# Patient Record
Sex: Male | Born: 1980 | Race: White | Hispanic: No | Marital: Married | Smoking: Current every day smoker
Health system: Southern US, Community
[De-identification: ages and names within clinical notes are randomized; demographics above are authoritative.]

## PROBLEM LIST (undated history)

## (undated) DIAGNOSIS — M419 Scoliosis, unspecified: Secondary | ICD-10-CM

## (undated) HISTORY — PX: WISDOM TOOTH EXTRACTION: SHX21

---
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
## 2004-01-30 ENCOUNTER — Emergency Department (HOSPITAL_COMMUNITY): Admission: EM | Admit: 2004-01-30 | Discharge: 2004-01-30 | Payer: Self-pay | Admitting: Emergency Medicine

## 2004-02-19 ENCOUNTER — Emergency Department (HOSPITAL_COMMUNITY): Admission: EM | Admit: 2004-02-19 | Discharge: 2004-02-20 | Payer: Self-pay | Admitting: Emergency Medicine

## 2004-03-30 ENCOUNTER — Ambulatory Visit: Payer: Self-pay | Admitting: Internal Medicine

## 2004-12-27 ENCOUNTER — Emergency Department (HOSPITAL_COMMUNITY): Admission: EM | Admit: 2004-12-27 | Discharge: 2004-12-27 | Payer: Self-pay | Admitting: *Deleted

## 2011-11-09 ENCOUNTER — Emergency Department: Payer: Self-pay | Admitting: Emergency Medicine

## 2012-12-14 ENCOUNTER — Emergency Department: Payer: Self-pay | Admitting: Emergency Medicine

## 2012-12-15 ENCOUNTER — Emergency Department: Payer: Self-pay | Admitting: Emergency Medicine

## 2019-08-05 ENCOUNTER — Emergency Department: Payer: No Typology Code available for payment source

## 2019-08-05 ENCOUNTER — Emergency Department
Admission: EM | Admit: 2019-08-05 | Discharge: 2019-08-05 | Disposition: A | Discharge status: Home / Self Care | Payer: No Typology Code available for payment source | Attending: Emergency Medicine | Admitting: Emergency Medicine

## 2019-08-05 ENCOUNTER — Other Ambulatory Visit: Payer: Self-pay

## 2019-08-05 DIAGNOSIS — Y939 Activity, unspecified: Secondary | ICD-10-CM | POA: Diagnosis not present

## 2019-08-05 DIAGNOSIS — G44319 Acute post-traumatic headache, not intractable: Secondary | ICD-10-CM | POA: Insufficient documentation

## 2019-08-05 DIAGNOSIS — Y92410 Unspecified street and highway as the place of occurrence of the external cause: Secondary | ICD-10-CM | POA: Diagnosis not present

## 2019-08-05 DIAGNOSIS — Y999 Unspecified external cause status: Secondary | ICD-10-CM | POA: Diagnosis not present

## 2019-08-05 DIAGNOSIS — M79641 Pain in right hand: Secondary | ICD-10-CM | POA: Insufficient documentation

## 2019-08-05 DIAGNOSIS — F17200 Nicotine dependence, unspecified, uncomplicated: Secondary | ICD-10-CM | POA: Diagnosis not present

## 2019-08-05 HISTORY — DX: Scoliosis, unspecified: M41.9

## 2019-08-05 MED ORDER — TRAMADOL HCL 50 MG PO TABS: 50.0000 mg | ORAL_TABLET | Freq: Four times a day (QID) | ORAL | 0 refills | Status: DC | PRN
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
## 2019-08-05 NOTE — Discharge Instructions (Signed)
Call the clinics listed on your discharge papers to see if they are taking new patients and establish a primary care provider.  Also take steps to discontinue smoking.  Your primary care provider may refer you to a pulmonologist who specializes in lung diseases.  Your x-rays today did not show any fractures or injury to your head.  You may continue taking over-the-counter medication such as Tylenol or ibuprofen with this medication if needed.  Do not drive or operate machinery while taking tramadol as it could cause drowsiness.
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
## 2019-08-06 ENCOUNTER — Encounter: Payer: Self-pay | Admitting: Nurse Practitioner

## 2019-08-06 ENCOUNTER — Ambulatory Visit: Payer: Self-pay | Admitting: Family Medicine

## 2019-08-06 DIAGNOSIS — J439 Emphysema, unspecified: Secondary | ICD-10-CM | POA: Insufficient documentation

## 2019-08-06 DIAGNOSIS — F17219 Nicotine dependence, cigarettes, with unspecified nicotine-induced disorders: Secondary | ICD-10-CM | POA: Insufficient documentation

## 2019-08-07 ENCOUNTER — Other Ambulatory Visit: Payer: Self-pay

## 2019-08-07 ENCOUNTER — Encounter: Payer: Self-pay | Admitting: Nurse Practitioner

## 2019-08-07 ENCOUNTER — Telehealth (INDEPENDENT_AMBULATORY_CARE_PROVIDER_SITE_OTHER): Payer: Self-pay | Admitting: Nurse Practitioner

## 2019-08-07 VITALS — Ht 73.0 in | Wt 145.0 lb

## 2019-08-07 DIAGNOSIS — Z7689 Persons encountering health services in other specified circumstances: Secondary | ICD-10-CM

## 2019-08-07 DIAGNOSIS — J439 Emphysema, unspecified: Secondary | ICD-10-CM

## 2019-08-07 DIAGNOSIS — M19041 Primary osteoarthritis, right hand: Secondary | ICD-10-CM

## 2019-08-07 DIAGNOSIS — F17219 Nicotine dependence, cigarettes, with unspecified nicotine-induced disorders: Secondary | ICD-10-CM

## 2019-08-07 DIAGNOSIS — M542 Cervicalgia: Secondary | ICD-10-CM

## 2019-08-07 MED ORDER — CYCLOBENZAPRINE HCL 10 MG PO TABS: 10.0000 mg | ORAL_TABLET | Freq: Three times a day (TID) | ORAL | 1 refills | Status: AC | PRN

## 2019-08-07 MED ORDER — BUPROPION HCL ER (SR) 150 MG PO TB12: ORAL_TABLET | ORAL | 4 refills | Status: AC
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
## 2019-08-07 NOTE — Assessment & Plan Note (Signed)
I have recommended complete cessation of tobacco use. I have discussed various options available for assistance with tobacco cessation including over the counter methods (Nicotine gum, patch and lozenges). We also discussed prescription options (Wellbutrin, Chantix, Nicotine Inhaler / Nasal Spray). The patient is interested in trying Wellbutrin, script sent in and will plan to follow-up in 6 weeks.
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
## 2019-08-07 NOTE — Patient Instructions (Signed)
Chronic Obstructive Pulmonary Disease Chronic obstructive pulmonary disease (COPD) is a long-term (chronic) lung problem. When you have COPD, it is hard for air to get in and out of your lungs. Usually the condition gets worse over time, and your lungs will never return to normal. There are things you can do to keep yourself as healthy as possible.  Your doctor may treat your condition with: ? Medicines. ? Oxygen. ? Lung surgery.  Your doctor may also recommend: ? Rehabilitation. This includes steps to make your body work better. It may involve a team of specialists. ? Quitting smoking, if you smoke. ? Exercise and changes to your diet. ? Comfort measures (palliative care). Follow these instructions at home: Medicines  Take over-the-counter and prescription medicines only as told by your doctor.  Talk to your doctor before taking any cough or allergy medicines. You may need to avoid medicines that cause your lungs to be dry. Lifestyle  If you smoke, stop. Smoking makes the problem worse. If you need help quitting, ask your doctor.  Avoid being around things that make your breathing worse. This may include smoke, chemicals, and fumes.  Stay active, but remember to rest as well.  Learn and use tips on how to relax.  Make sure you get enough sleep. Most adults need at least 7 hours of sleep every night.  Eat healthy foods. Eat smaller meals more often. Rest before meals. Controlled breathing Learn and use tips on how to control your breathing as told by your doctor. Try:  Breathing in (inhaling) through your nose for 1 second. Then, pucker your lips and breath out (exhale) through your lips for 2 seconds.  Putting one hand on your belly (abdomen). Breathe in slowly through your nose for 1 second. Your hand on your belly should move out. Pucker your lips and breathe out slowly through your lips. Your hand on your belly should move in as you breathe out.  Controlled coughing Learn  and use controlled coughing to clear mucus from your lungs. Follow these steps: 1. Lean your head a little forward. 2. Breathe in deeply. 3. Try to hold your breath for 3 seconds. 4. Keep your mouth slightly open while coughing 2 times. 5. Spit any mucus out into a tissue. 6. Rest and do the steps again 1 or 2 times as needed. General instructions  Make sure you get all the shots (vaccines) that your doctor recommends. Ask your doctor about a flu shot and a pneumonia shot.  Use oxygen therapy and pulmonary rehabilitation if told by your doctor. If you need home oxygen therapy, ask your doctor if you should buy a tool to measure your oxygen level (oximeter).  Make a COPD action plan with your doctor. This helps you to know what to do if you feel worse than usual.  Manage any other conditions you have as told by your doctor.  Avoid going outside when it is very hot, cold, or humid.  Avoid people who have a sickness you can catch (contagious).  Keep all follow-up visits as told by your doctor. This is important. Contact a doctor if:  You cough up more mucus than usual.  There is a change in the color or thickness of the mucus.  It is harder to breathe than usual.  Your breathing is faster than usual.  You have trouble sleeping.  You need to use your medicines more often than usual.  You have trouble doing your normal activities such as getting dressed   or walking around the house. Get help right away if:  You have shortness of breath while resting.  You have shortness of breath that stops you from: ? Being able to talk. ? Doing normal activities.  Your chest hurts for longer than 5 minutes.  Your skin color is more blue than usual.  Your pulse oximeter shows that you have low oxygen for longer than 5 minutes.  You have a fever.  You feel too tired to breathe normally. Summary  Chronic obstructive pulmonary disease (COPD) is a long-term lung problem.  The way your  lungs work will never return to normal. Usually the condition gets worse over time. There are things you can do to keep yourself as healthy as possible.  Take over-the-counter and prescription medicines only as told by your doctor.  If you smoke, stop. Smoking makes the problem worse. This information is not intended to replace advice given to you by your health care provider. Make sure you discuss any questions you have with your health care provider. Document Revised: 03/02/2017 Document Reviewed: 04/24/2016 Elsevier Patient Education  2020 [employer]  

## 2019-08-07 NOTE — Assessment & Plan Note (Signed)
New diagnosis noted on imaging in ER.  No current symptoms.  Will plan on spirometry at upcoming visit and possible Albuterol inhaler to use as needed.  Discussed importance of smoking cessation to help slow down progression of disease process.  He is interested in quitting, Wellbutrin script sent in.  Return to office in 6 weeks for follow-up.

## 2019-08-08 ENCOUNTER — Encounter: Payer: Self-pay | Admitting: Nurse Practitioner

## 2019-08-08 ENCOUNTER — Telehealth: Payer: Self-pay | Admitting: Nurse Practitioner

## 2019-08-08 NOTE — Telephone Encounter (Signed)
lvm to make this apt. Sent letter.  

## 2019-08-08 NOTE — Telephone Encounter (Signed)
-----   Message from Marjie Skiff, NP sent at 08/07/2019 12:16 PM EDT ----- 6 weeks follow-up need

## 2019-08-12 ENCOUNTER — Other Ambulatory Visit: Payer: Self-pay

## 2019-08-12 ENCOUNTER — Encounter: Payer: Self-pay | Admitting: Nurse Practitioner

## 2019-08-12 ENCOUNTER — Ambulatory Visit (INDEPENDENT_AMBULATORY_CARE_PROVIDER_SITE_OTHER): Payer: Self-pay | Admitting: Nurse Practitioner

## 2019-08-12 DIAGNOSIS — F17219 Nicotine dependence, cigarettes, with unspecified nicotine-induced disorders: Secondary | ICD-10-CM

## 2019-08-12 DIAGNOSIS — J439 Emphysema, unspecified: Secondary | ICD-10-CM

## 2019-08-12 DIAGNOSIS — M5441 Lumbago with sciatica, right side: Secondary | ICD-10-CM

## 2019-08-12 DIAGNOSIS — M19041 Primary osteoarthritis, right hand: Secondary | ICD-10-CM

## 2019-08-12 DIAGNOSIS — G8929 Other chronic pain: Secondary | ICD-10-CM

## 2019-08-12 MED ORDER — GABAPENTIN 300 MG PO CAPS: 300.0000 mg | ORAL_CAPSULE | Freq: Three times a day (TID) | ORAL | 3 refills | Status: AC

## 2019-08-12 MED ORDER — TRAMADOL HCL 50 MG PO TABS: 50.0000 mg | ORAL_TABLET | Freq: Four times a day (QID) | ORAL | 0 refills | Status: AC | PRN

## 2019-08-12 NOTE — Assessment & Plan Note (Signed)
I have recommended complete cessation of tobacco use. I have discussed various options available for assistance with tobacco cessation including over the counter methods (Nicotine gum, patch and lozenges). We also discussed prescription options (Wellbutrin, Chantix, Nicotine Inhaler / Nasal Spray). Started Wellbutrin yesterday, continue this.  Return in 3 months.

## 2019-08-12 NOTE — Assessment & Plan Note (Signed)
New diagnosis noted on imaging in ER.  No current symptoms.  Will plan on spirometry at upcoming visit and possible Albuterol inhaler to use as needed.  Discussed importance of smoking cessation to help slow down progression of disease process.  Just started Wellbutrin yesterday, continue this.   Return to office in 3 months for follow-up.
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
## 2019-08-12 NOTE — Assessment & Plan Note (Signed)
Reports ongoing for years.  Will trial Gabapentin at night, 300 MG, script sent.  Consider imaging if ongoing pain present.  Recommend continued use of Tylenol and Ibuprofen at home as needed + alternating heat and ice.  Discussed benefit of daily stretching and regular exercise.  Return to office in 3 months, sooner if worsening/ongoing pain.  Discussed that chronic pain management now performed in office and if ongoing pain with poor control will consider PT and pain clinic referrals.
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
## 2019-08-12 NOTE — Patient Instructions (Signed)
Eating Plan for Chronic Obstructive Pulmonary Disease Chronic obstructive pulmonary disease (COPD) causes symptoms such as shortness of breath, coughing, and chest discomfort. These symptoms can make it difficult to eat enough to maintain a healthy weight. Generally, people with COPD should eat a diet that is high in calories, protein, and other nutrients to maintain body weight and to keep the lungs as healthy as possible. Depending on the medicines you take and other health conditions you may have, your health care provider may give you additional recommendations on what to eat or avoid. Talk with your health care provider about your goals for body weight, and work with a dietitian to develop an eating plan that is right for you. What are tips for following this plan? Reading food labels   Avoid foods with more than 300 milligrams (mg) of salt (sodium) per serving.  Choose foods that contain at least 4 grams (g) of fiber per serving. Try to eat 20-30 g of fiber each day.  Choose foods that are high in calories and protein, such as nuts, beans, yogurt, and cheese. Shopping  Do not buy foods labeled as diet, low-calorie, or low-fat.  If you are able to eat dairy products: ? Avoid low-fat or skim milk. ? Buy dairy products that have at least 2% fat.  Buy nutritional supplement drinks.  Buy grains and prepared foods labeled as enriched or fortified.  Consider buying low-sodium, pre-made foods to conserve energy for eating. Cooking  Add dry milk or protein powder to smoothies.  Cook with healthy fats, such as olive oil, canola oil, sunflower oil, and grapeseed oil.  Add oil, butter, cream cheese, or nut butters to foods to increase fat and calories.  To make foods easier to chew and swallow: ? Cook vegetables, pasta, and rice until soft. ? Cut or grind meat into very small pieces. ? Dip breads in liquid. Meal planning   Eat when you feel hungry.  Eat 5-6 small meals throughout  the day.  Drink 6-8 glasses of water each day.  Do not drink liquids with meals. Drink liquids at the end of the meal to avoid feeling full too quickly.  Eat a variety of fruits and vegetables every day.  Ask for assistance from family or friends with planning and preparing meals as needed.  Avoid foods that cause you to feel bloated, such as carbonated drinks, fried foods, beans, broccoli, cabbage, and apples.  For older adults, ask your local agency on aging whether you are eligible for meal assistance programs, such as Meals on Wheels. Lifestyle   Do not smoke.  Eat slowly. Take small bites and chew food well before swallowing.  Do not overeat. This may make it more difficult to breathe after eating.  Sit up while eating.  If needed, continue to use supplemental oxygen while eating.  Rest or relax for 30 minutes before and after eating.  Monitor your weight as told by your health care provider.  Exercise as told by your health care provider. What foods can I eat? Fruits All fresh, dried, canned, or frozen fruits that do not cause gas. Vegetables All fresh, canned (no salt added), or frozen vegetables that do not cause gas. Grains Whole grain bread. Enriched whole grain pasta. Fortified whole grain cereals. Fortified rice. Quinoa. Meats and other proteins Lean meat. Poultry. Fish. Dried beans. Unsalted nuts. Tofu. Eggs. Nut butters. Dairy Whole or 2% milk. Cheese. Yogurt. Fats and oils Olive oil. Canola oil. Butter. Margarine. Beverages Water. Vegetable   juice (no salt added). Decaffeinated coffee. Decaffeinated or herbal tea. Seasonings and condiments Fresh or dried herbs. Low-salt or salt-free seasonings. Low-sodium soy sauce. The items listed above may not be a complete list of foods and beverages you can eat. Contact a dietitian for more information. What foods are not recommended? Fruits Fruits that cause gas, such as apples or melon. Vegetables Vegetables  that cause gas, such as broccoli, Brussels sprouts, cabbage, cauliflower, and onions. Canned vegetables with added salt. Meats and other proteins Fried meat. Salt-cured meat. Processed meat. Dairy Fat-free or low-fat milk, yogurt, or cheese. Processed cheese. Beverages Carbonated drinks. Caffeinated drinks, such as coffee, tea, and soft drinks. Juice. Alcohol. Vegetable juice with added salt. Seasonings and condiments Salt. Seasoning mixes with salt. Soy sauce. Pickles. Other foods Clear soup or broth. Fried foods. Prepared frozen meals. The items listed above may not be a complete list of foods and beverages you should avoid. Contact a dietitian for more information. Summary  COPD symptoms can make it difficult to eat enough to maintain a healthy weight.  A COPD eating plan can help you maintain your body weight and keep your lungs as healthy as possible.  Eat a diet that is high in calories, protein, and other nutrients. Read labels to make sure that you are getting the right nutrients. Cook foods to make them easy to chew and swallow.  Eat 5-6 small meals throughout the day, and avoid foods that cause gas or make you feel bloated. This information is not intended to replace advice given to you by your health care provider. Make sure you discuss any questions you have with your health care provider. Document Revised: 07/11/2018 Document Reviewed: 06/05/2017 Elsevier Patient Education  2020 [employer]  

## 2019-10-21 ENCOUNTER — Telehealth: Payer: Self-pay | Admitting: Nurse Practitioner

## 2019-10-21 NOTE — Telephone Encounter (Signed)
Individual has been contacted 3+ times regarding ED referral. No further attempts to contact individual will be made. 

## 2019-11-18 ENCOUNTER — Ambulatory Visit: Payer: Self-pay | Admitting: Nurse Practitioner

## 2020-08-16 ENCOUNTER — Emergency Department
Admission: EM | Admit: 2020-08-16 | Discharge: 2020-08-16 | Disposition: A | Discharge status: Home / Self Care | Payer: Self-pay | Attending: Emergency Medicine | Admitting: Emergency Medicine

## 2020-08-16 ENCOUNTER — Other Ambulatory Visit: Payer: Self-pay

## 2020-08-16 DIAGNOSIS — F1110 Opioid abuse, uncomplicated: Secondary | ICD-10-CM | POA: Insufficient documentation

## 2020-08-16 DIAGNOSIS — F17210 Nicotine dependence, cigarettes, uncomplicated: Secondary | ICD-10-CM | POA: Insufficient documentation

## 2020-08-16 DIAGNOSIS — F1290 Cannabis use, unspecified, uncomplicated: Secondary | ICD-10-CM | POA: Insufficient documentation

## 2020-08-16 LAB — URINE DRUG SCREEN, QUALITATIVE (ARMC ONLY)
Amphetamines, Ur Screen: NOT DETECTED
Barbiturates, Ur Screen: NOT DETECTED
Benzodiazepine, Ur Scrn: NOT DETECTED
Cannabinoid 50 Ng, Ur ~~LOC~~: NOT DETECTED
Cocaine Metabolite,Ur ~~LOC~~: NOT DETECTED
MDMA (Ecstasy)Ur Screen: NOT DETECTED
Methadone Scn, Ur: NOT DETECTED
Opiate, Ur Screen: NOT DETECTED
Phencyclidine (PCP) Ur S: NOT DETECTED
Tricyclic, Ur Screen: NOT DETECTED

## 2020-08-16 LAB — COMPREHENSIVE METABOLIC PANEL
ALT: 19 U/L (ref 0–44)
AST: 24 U/L (ref 15–41)
Albumin: 4.1 g/dL (ref 3.5–5.0)
Alkaline Phosphatase: 60 U/L (ref 38–126)
Anion gap: 9 (ref 5–15)
BUN: 9 mg/dL (ref 6–20)
CO2: 28 mmol/L (ref 22–32)
Calcium: 9.2 mg/dL (ref 8.9–10.3)
Chloride: 101 mmol/L (ref 98–111)
Creatinine, Ser: 0.61 mg/dL (ref 0.61–1.24)
GFR, Estimated: >60 mL/min (ref >60)
Glucose, Bld: 162 mg/dL — ABNORMAL HIGH (ref 70–99)
Potassium: 3.8 mmol/L (ref 3.5–5.1)
Sodium: 138 mmol/L (ref 135–145)
Total Bilirubin: 0.6 mg/dL (ref 0.3–1.2)
Total Protein: 7.3 g/dL (ref 6.5–8.1)

## 2020-08-16 LAB — CBC
HCT: 40.2 % (ref 39.0–52.0)
Hemoglobin: 14.1 g/dL (ref 13.0–17.0)
MCH: 32.9 pg (ref 26.0–34.0)
MCHC: 35.1 g/dL (ref 30.0–36.0)
MCV: 93.7 fL (ref 80.0–100.0)
Platelets: 350 10*3/uL (ref 150–400)
RBC: 4.29 MIL/uL (ref 4.22–5.81)
RDW: 12 % (ref 11.5–15.5)
WBC: 8.7 10*3/uL (ref 4.0–10.5)
nRBC: 0 % (ref 0.0–0.2)

## 2020-08-16 LAB — ETHANOL: Alcohol, Ethyl (B): <10 mg/dL (ref <10)

## 2020-08-16 MED ORDER — METHADONE HCL 10 MG/ML PO CONC
20.0000 mg | Freq: Once | ORAL | Status: AC
Start: 2020-08-16 — End: 2020-08-16
  Administered 2020-08-16: 20 mg via ORAL
  Filled 2020-08-16: qty 2

## 2020-08-16 MED ORDER — METHADONE HCL 10 MG/ML PO CONC: 20.0000 mg | Freq: Once | ORAL | Status: DC

## 2020-08-16 NOTE — ED Notes (Signed)
Dr Neta Ehlers requested a TTS consult for out patient services. Called TTS and spoke with Renee she is coming to see the patient.

## 2020-08-16 NOTE — ED Triage Notes (Signed)
Pt states "I have a drug problem and I need some help". 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 Pt is a/ox4 on arrival, ambulatory to triage with a steady gait.

## 2020-08-16 NOTE — Discharge Instructions (Addendum)
Follow-up at one of the outpatient substance treatment resources provided.  The methadone given today should help stop any withdrawal symptoms for the next 1 to 2 days.  Return to the ER for any new or worsening symptoms that concern you.

## 2020-08-16 NOTE — ED Provider Notes (Addendum)
[facility] Emergency Department Provider Note ____________________________________________   Event Date/Time   First MD Initiated Contact with Patient 08/16/20 1117     (approximate)  I have reviewed the triage vital signs and the nursing notes.   HISTORY  Chief Complaint Drug Problem    HPI Zachary Schwartz is a 40 y.o. male with PMH as noted below as well as history of substance abuse who presents requesting referral for substance abuse treatment.  The patient states that he abuses "blue 30s" which he believes to be Percocet.  He states he also smokes marijuana but denies any other illicit drug use or alcohol use.  He has been taking the opiate pills for several years.  He states that he has tried to stop on his own in the past.  He would like outpatient referral.  He denies any acute symptoms today.  He states he last took the pill earlier this morning.  Past Medical History:  Diagnosis Date  . Scoliosis     Patient Active Problem List   Diagnosis Date Noted  . Chronic low back pain 08/12/2019  . Arthritis of right hand 08/07/2019  . Neck pain 08/07/2019  . Bullous emphysema (HCC) 08/06/2019  . Nicotine dependence, cigarettes, w unsp disorders 08/06/2019    Past Surgical History:  Procedure Laterality Date  . WISDOM TOOTH EXTRACTION      Prior to Admission medications   Medication Sig Start Date End Date Taking? Authorizing Provider  buPROPion (WELLBUTRIN SR) 150 MG 12 hr tablet Take 150 mg (1 tablet) once daily by mouth for 3 days and then increase to 150 mg (1 tablet) two times daily. 08/07/19   Cannady, Corrie Dandy T, NP  cyclobenzaprine (FLEXERIL) 10 MG tablet Take 1 tablet (10 mg total) by mouth 3 (three) times daily as needed for muscle spasms. 08/07/19   Cannady, Corrie Dandy T, NP  gabapentin (NEURONTIN) 300 MG capsule Take 1 capsule (300 mg total) by mouth 3 (three) times daily. 08/12/19   Marjie Skiff, NP    Allergies Patient has no known  allergies.  Family History  Problem Relation Age of Onset  . Heart attack Mother   . Stroke Mother   . Heart disease Mother   . Cirrhosis Father   . Alcohol abuse Father   . Lung cancer Maternal Grandfather   . Cancer Maternal Grandfather        Lung Cancer  . Diabetes Brother   . Down syndrome Daughter   . Pneumonia Paternal Grandmother   . Diabetes Brother     Social History Social History   Tobacco Use  . Smoking status: Current Every Day Smoker    Packs/day: 1.00    Types: Cigarettes  . Smokeless tobacco: Never Used  Vaping Use  . Vaping Use: Former  . Devices: Only tried for a while to try and quit smoking.  Substance Use Topics  . Alcohol use: Not Currently  . Drug use: Yes    Types: Marijuana    Comment: opiods    Review of Systems  Constitutional: No fever/chills Eyes: No visual changes. ENT: No sore throat. Cardiovascular: Denies chest pain. Respiratory: Denies shortness of breath. Gastrointestinal: No vomiting or diarrhea.  Genitourinary: Negative for dysuria.  Musculoskeletal: Negative for acute back pain. Skin: Negative for rash. Neurological: Negative for headache.   ____________________________________________   PHYSICAL EXAM:  VITAL SIGNS: ED Triage Vitals  Enc Vitals Group     BP 08/16/20 0959 (!) 123/94  Pulse Rate 08/16/20 0959 (!) 102     Resp 08/16/20 0959 18     Temp 08/16/20 0959 98.2 F (36.8 C)     Temp Source 08/16/20 0959 Oral     SpO2 08/16/20 0959 95 %     Weight 08/16/20 1000 140 lb (63.5 kg)     Height 08/16/20 1000 6' (1.829 m)     Head Circumference --      Peak Flow --      Pain Score 08/16/20 1000 0     Pain Loc --      Pain Edu? --      Excl. in GC? --     Constitutional: Alert and oriented. Well appearing and in no acute distress. Eyes: Conjunctivae are normal.  Head: Atraumatic. Nose: No congestion/rhinnorhea. Mouth/Throat: Mucous membranes are moist.   Neck: Normal range of motion.   Cardiovascular: Normal rate, regular rhythm.  Good peripheral circulation. Respiratory: Normal respiratory effort.  No retractions.  Gastrointestinal: No distention.  Musculoskeletal: Extremities warm and well perfused.  Neurologic:  Normal speech and language. No gross focal neurologic deficits are appreciated.  Skin:  Skin is warm and dry. No rash noted. Psychiatric: Mood and affect are normal. Speech and behavior are normal.  ____________________________________________   LABS (all labs ordered are listed, but only abnormal results are displayed)  Labs Reviewed  COMPREHENSIVE METABOLIC PANEL - Abnormal; Notable for the following components:      Result Value   Glucose, Bld 162 (*)    All other components within normal limits  ETHANOL  CBC  URINE DRUG SCREEN, QUALITATIVE ([facility] ONLY)   ____________________________________________  EKG   ____________________________________________  RADIOLOGY    ____________________________________________   PROCEDURES  Procedure(s) performed: No  Procedures  Critical Care performed: No ____________________________________________   INITIAL IMPRESSION / ASSESSMENT AND PLAN / ED COURSE  Pertinent labs & imaging results that were available during my care of the patient were reviewed by me and considered in my medical decision making (see chart for details).  40 year old male with PMH as noted above presents for substance abuse treatment referral.  He reports taking opiate pain pills but denies illicit drug use other than marijuana.  He last took the medication this morning.  He denies any acute symptoms.  On exam he is overall well-appearing.  The vital signs are normal.  Physical exam is otherwise unremarkable.  We will obtain TTS consult.  ----------------------------------------- 2:08 PM on 08/16/2020 -----------------------------------------  Lab work-up is unremarkable.  Interestingly, the patient's UDS is negative for  opiates.  We have given a dose of methadone in the ED to prevent withdrawal.  The patient has been provided resources by TTS.  He is stable for discharge at this time.  Return precautions given, and he expresses understanding. ____________________________________________   FINAL CLINICAL IMPRESSION(S) / ED DIAGNOSES  Final diagnoses:  Opiate abuse, continuous (HCC)      NEW MEDICATIONS STARTED DURING THIS VISIT:  New Prescriptions   No medications on file     Note:  This document was prepared using Dragon voice recognition software and may include unintentional dictation errors.        Dionne Bucy, MD 08/16/20 1408
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
